# Patient Record
Sex: Female | Born: 1980 | Race: White | Hispanic: No | Marital: Married | Smoking: Never smoker
Health system: Southern US, Community
[De-identification: ages and names within clinical notes are randomized; demographics above are authoritative.]

## PROBLEM LIST (undated history)

## (undated) DIAGNOSIS — A770 Spotted fever due to Rickettsia rickettsii: Secondary | ICD-10-CM

## (undated) DIAGNOSIS — G43909 Migraine, unspecified, not intractable, without status migrainosus: Secondary | ICD-10-CM

## (undated) DIAGNOSIS — K626 Ulcer of anus and rectum: Secondary | ICD-10-CM

## (undated) DIAGNOSIS — R87619 Unspecified abnormal cytological findings in specimens from cervix uteri: Secondary | ICD-10-CM

## (undated) HISTORY — PX: TUBAL LIGATION: SHX77

## (undated) HISTORY — DX: Unspecified abnormal cytological findings in specimens from cervix uteri: R87.619

## (undated) HISTORY — PX: OTHER SURGICAL HISTORY: SHX169

## (undated) HISTORY — DX: Ulcer of anus and rectum: K62.6

## (undated) HISTORY — DX: Migraine, unspecified, not intractable, without status migrainosus: G43.909

## (undated) HISTORY — DX: Spotted fever due to Rickettsia rickettsii: A77.0

---

## 1999-02-14 ENCOUNTER — Other Ambulatory Visit: Admission: RE | Admit: 1999-02-14 | Discharge: 1999-02-14 | Payer: Self-pay | Admitting: Family Medicine

## 1999-08-30 ENCOUNTER — Encounter (INDEPENDENT_AMBULATORY_CARE_PROVIDER_SITE_OTHER): Payer: Self-pay | Admitting: Specialist

## 1999-08-30 ENCOUNTER — Ambulatory Visit (HOSPITAL_COMMUNITY): Admission: RE | Admit: 1999-08-30 | Discharge: 1999-08-30 | Payer: Self-pay | Admitting: Obstetrics and Gynecology

## 2000-06-05 ENCOUNTER — Other Ambulatory Visit: Admission: RE | Admit: 2000-06-05 | Discharge: 2000-06-05 | Payer: Self-pay | Admitting: *Deleted

## 2000-12-29 ENCOUNTER — Inpatient Hospital Stay (HOSPITAL_COMMUNITY): Admission: AD | Admit: 2000-12-29 | Discharge: 2001-01-01 | Payer: Self-pay | Admitting: *Deleted

## 2001-01-29 ENCOUNTER — Other Ambulatory Visit: Admission: RE | Admit: 2001-01-29 | Discharge: 2001-01-29 | Payer: Self-pay | Admitting: Obstetrics and Gynecology

## 2001-05-08 ENCOUNTER — Encounter: Payer: Self-pay | Admitting: Obstetrics and Gynecology

## 2001-05-08 ENCOUNTER — Inpatient Hospital Stay (HOSPITAL_COMMUNITY): Admission: AD | Admit: 2001-05-08 | Discharge: 2001-05-08 | Payer: Self-pay | Admitting: Obstetrics and Gynecology

## 2001-05-12 ENCOUNTER — Encounter: Payer: Self-pay | Admitting: Obstetrics and Gynecology

## 2001-05-12 ENCOUNTER — Encounter: Payer: Self-pay | Admitting: *Deleted

## 2001-05-12 ENCOUNTER — Ambulatory Visit (HOSPITAL_COMMUNITY): Admission: RE | Admit: 2001-05-12 | Discharge: 2001-05-12 | Payer: Self-pay | Admitting: *Deleted

## 2001-12-14 ENCOUNTER — Inpatient Hospital Stay (HOSPITAL_COMMUNITY): Admission: AD | Admit: 2001-12-14 | Discharge: 2001-12-15 | Payer: Self-pay | Admitting: Obstetrics and Gynecology

## 2002-01-26 ENCOUNTER — Other Ambulatory Visit: Admission: RE | Admit: 2002-01-26 | Discharge: 2002-01-26 | Payer: Self-pay | Admitting: *Deleted

## 2007-06-23 ENCOUNTER — Ambulatory Visit (HOSPITAL_COMMUNITY): Admission: RE | Admit: 2007-06-23 | Discharge: 2007-06-23 | Payer: Self-pay | Admitting: Family Medicine

## 2009-12-06 IMAGING — CT CT HEAD W/O CM
1 series · 16 of 30 positions shown, 20 images · non-contrast
Comparison: None

CLINICAL DATA: Severe headache with blurred vision.

CT HEAD WITHOUT CONTRAST
TECHNIQUE: Contiguous axial images were obtained from the base of
the skull through the vertex without contrast.

[Series 2: (id) head 4.8 h37s st · axial · 0.47mm/px · z∈[+75,+206]mm · 16 of 30 slices shown, 20 images]
[im 2/30  brain]
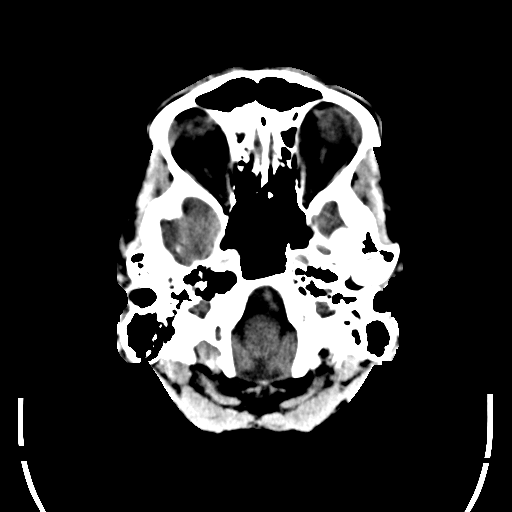
[im 2/30  bone]
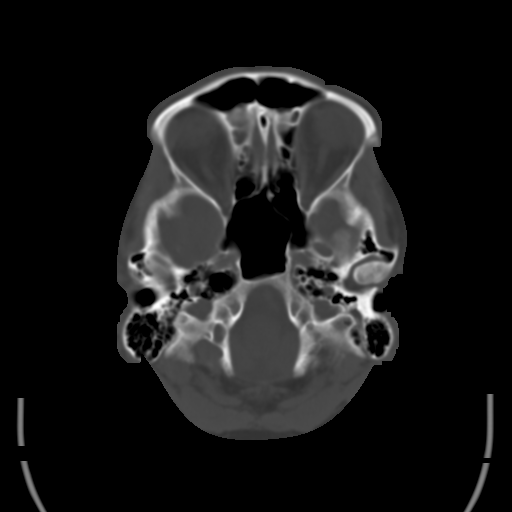
[im 4/30  brain]
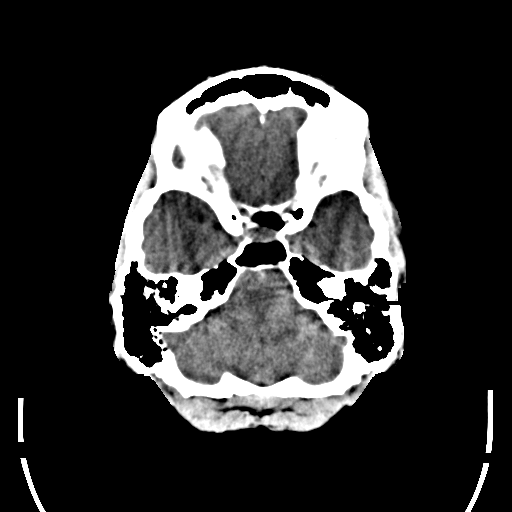
[im 6/30  brain]
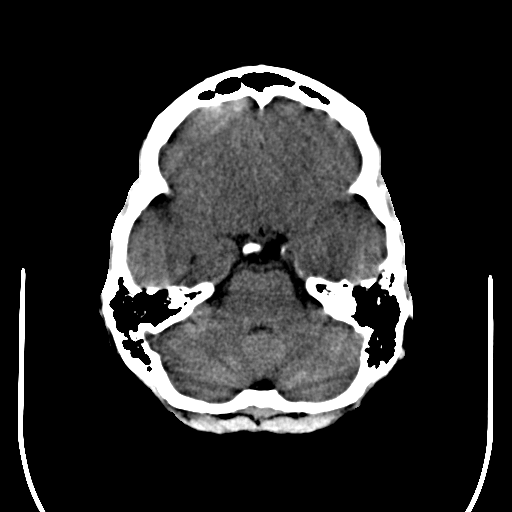
[im 8/30  brain]
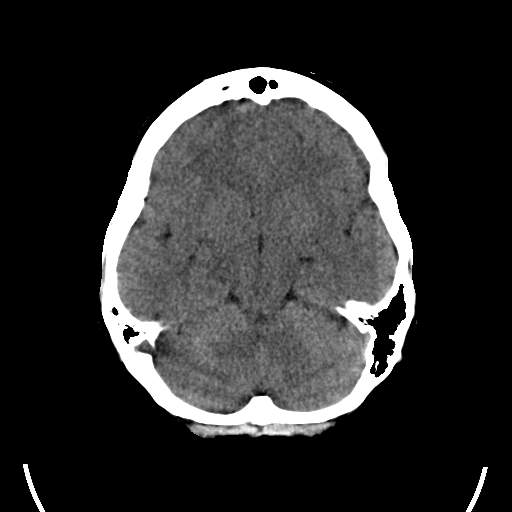
[im 9/30  brain]
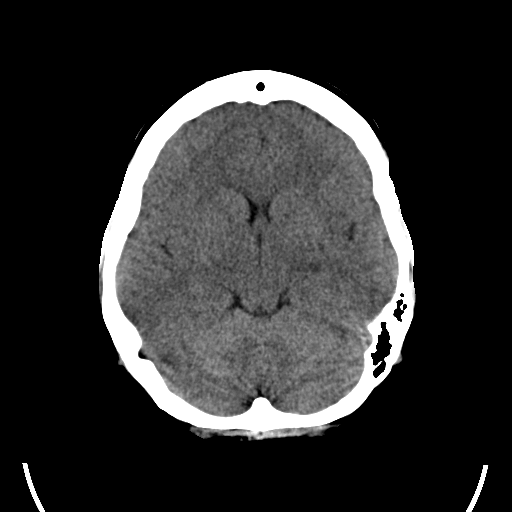
[im 9/30  bone]
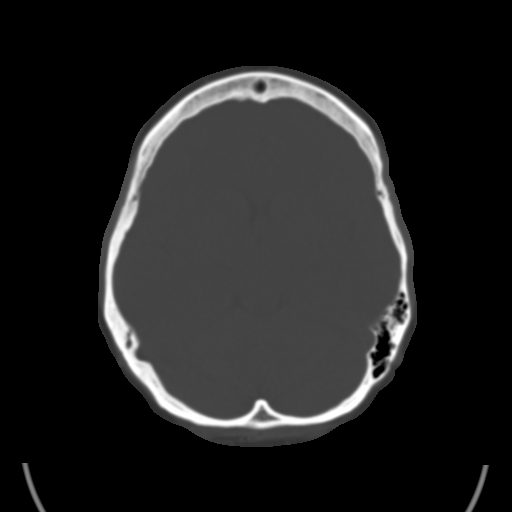
[im 11/30  brain]
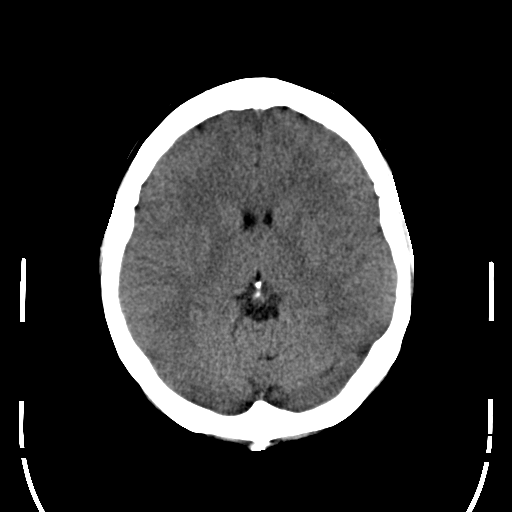
[im 13/30  brain]
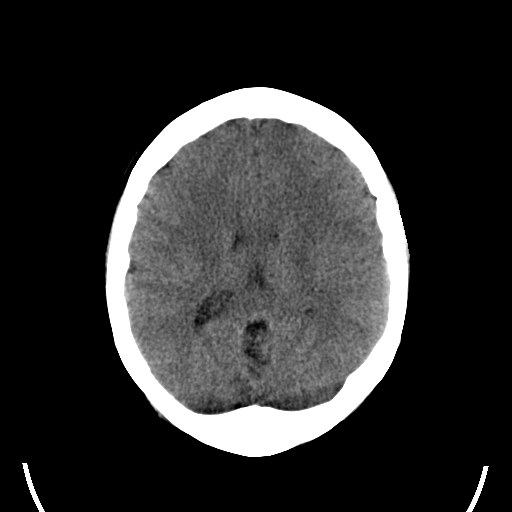
[im 15/30  brain]
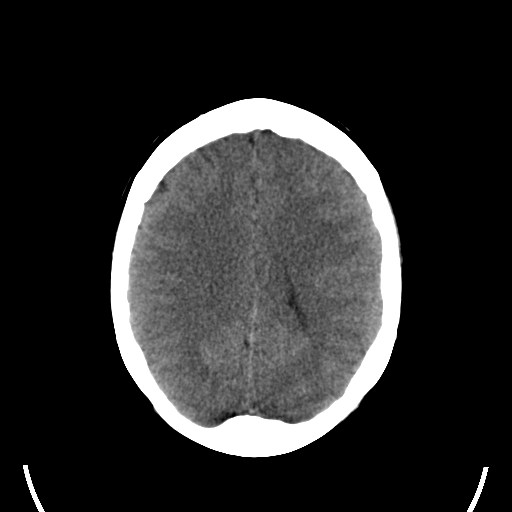
[im 16/30  brain]
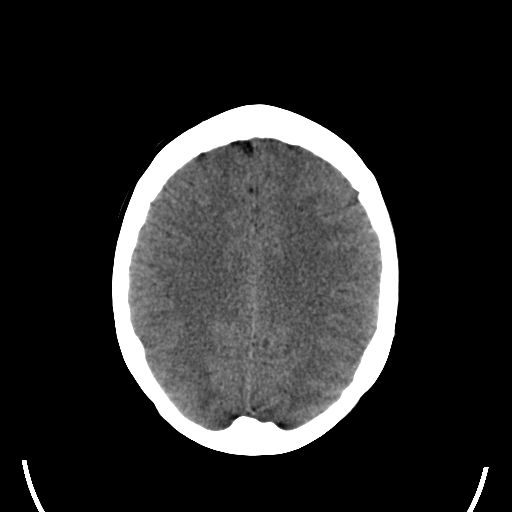
[im 16/30  bone]
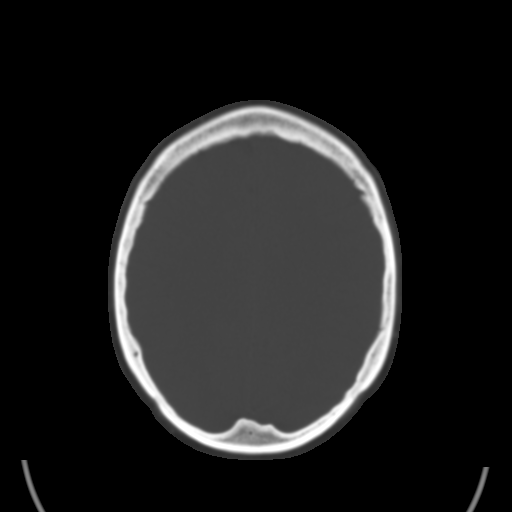
[im 18/30  brain]
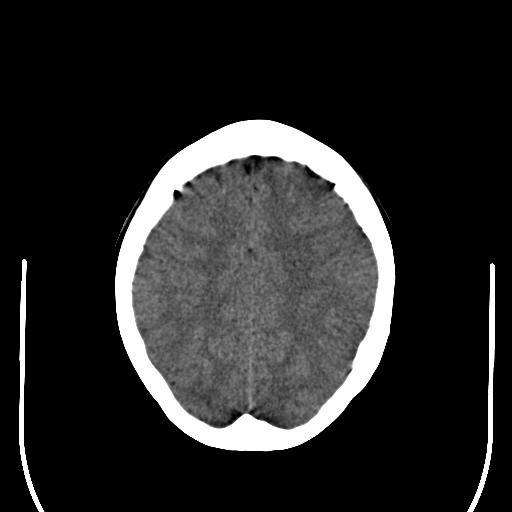
[im 20/30  brain]
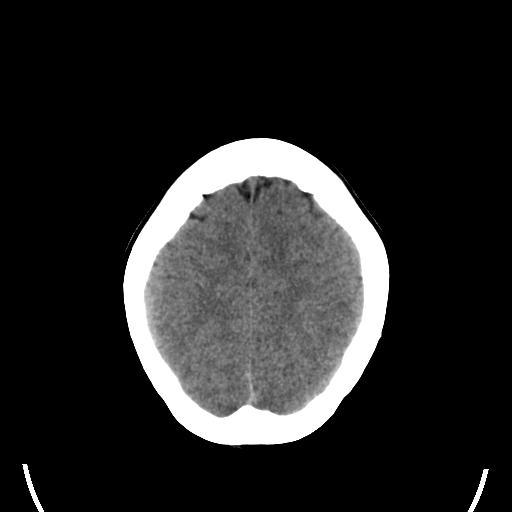
[im 22/30  brain]
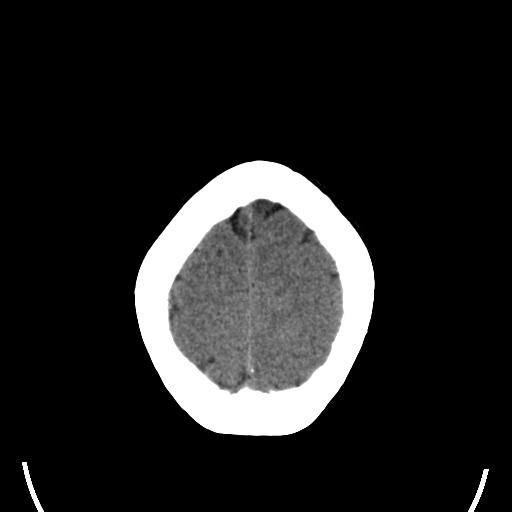
[im 23/30  brain]
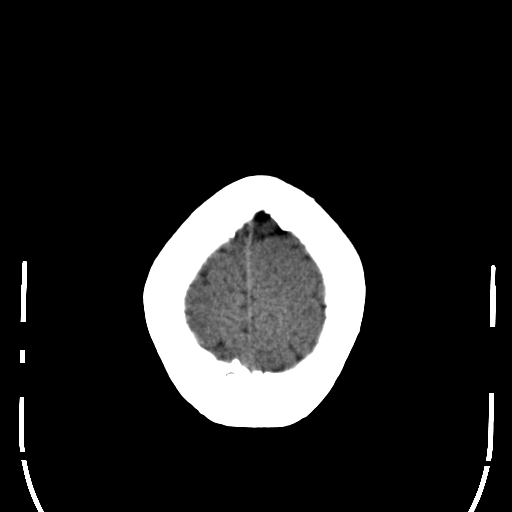
[im 23/30  bone]
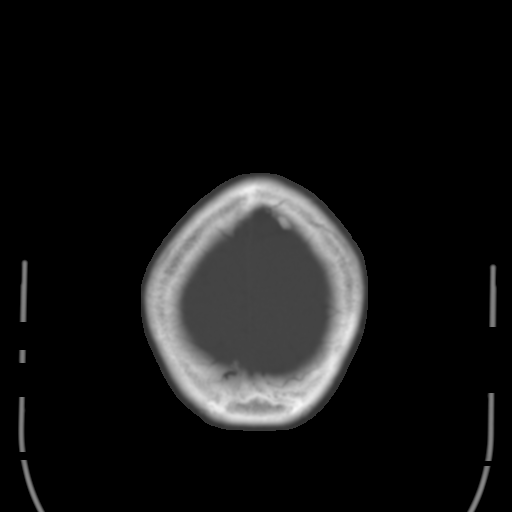
[im 25/30  brain]
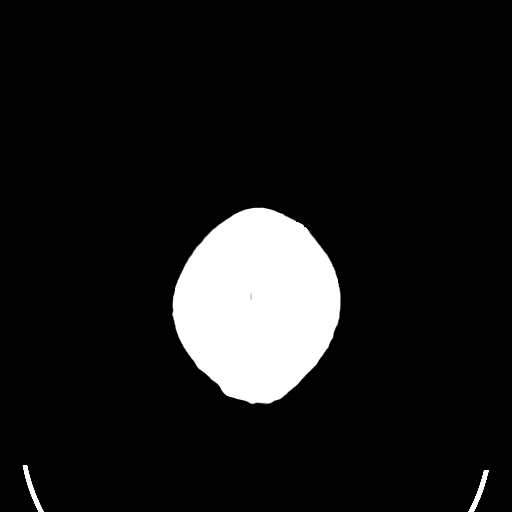
[im 27/30  brain]
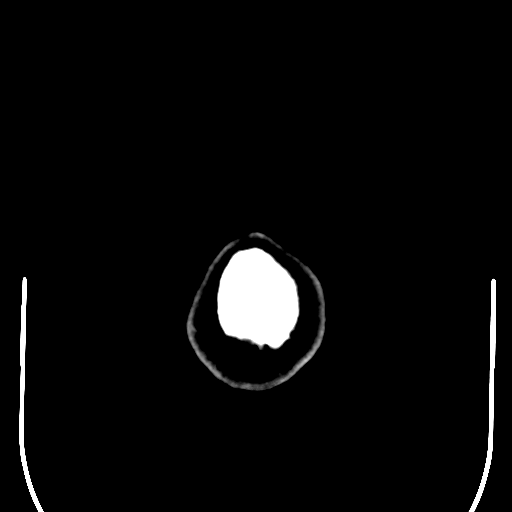
[im 29/30  brain]
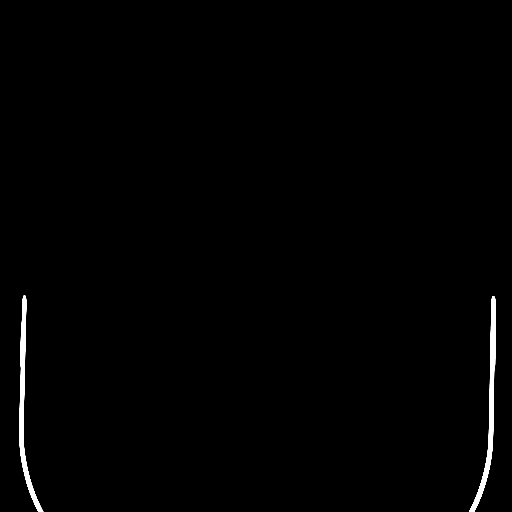

[16 of 30 positions shown; findings below may reference images not displayed]

FINDINGS: No acute intracranial abnormalities are identified,
including mass lesion or mass effect, hydrocephalus, extra-axial
fluid collection, midline shift, hemorrhage, or acute infarction.
Please note that acute infarction may be occult on CT for 24-48
hours.

The visualized bony calvarium is unremarkable.
IMPRESSION: No evidence of acute intracranial abnormality.

## 2010-06-08 NOTE — H&P (Signed)
[facility]  Patient:    Kristine Hahn, Kristine Hahn                       MRN: [id number] Adm. Date:  08/30/99 Attending:  Duke Salvia. Marcelle Overlie, M.D.                         History and Physical  CHIEF COMPLAINT:              Missed abortion.  HISTORY OF PRESENT ILLNESS:   A 30 year old G 1, P 0, new obstetrical patient was seen yesterday with some spotting.  An ultrasound showed an eight-week fetus.  No FHR was noted.  She presents now for a D&E as treated for a missed abortion.  This procedure, including the risks of bleeding, infection, other complications such as perforation may require additional surgery were reviewed with her.  ALLERGIES:                    No known drug allergies.  PAST SURGICAL HISTORY:        None.  REVIEW OF SYSTEMS:            Otherwise negative.  PHYSICAL EXAMINATION:  VITAL SIGNS:                  Temperature 98.2 degrees, blood pressure 120/72.  HEENT:                        Unremarkable.  NECK:                         Supple without mass.  LUNGS:                        Clear.  CARDIOVASCULAR:               A regular rate and rhythm without murmurs, rubs, or gallops.  BREASTS:                      Not examined.  ABDOMEN:                      Soft, flat, nontender.  PELVIC:                       Normal external genitalia.  Vagina and cervix clear.  Minimal spotting was noted.  Cervix was closed.  Uterus eight to nine-week size.  No position.  Adnexa negative.  EXTREMITIES:                  Unremarkable.  NEUROLOGIC:                   Unremarkable.  IMPRESSION:                   Missed abortion.  PLAN:                         Dilatation and evacuation.  The procedure and the risks were discussed as above. DD:  08/30/99 TD:  08/30/99 Job: [id number]

## 2011-01-22 LAB — HM COLONOSCOPY

## 2011-05-07 DIAGNOSIS — R87619 Unspecified abnormal cytological findings in specimens from cervix uteri: Secondary | ICD-10-CM

## 2011-05-07 HISTORY — DX: Unspecified abnormal cytological findings in specimens from cervix uteri: R87.619

## 2011-05-07 LAB — HM PAP SMEAR: HM Pap smear: NEGATIVE

## 2011-07-13 DIAGNOSIS — L280 Lichen simplex chronicus: Secondary | ICD-10-CM | POA: Insufficient documentation

## 2011-07-24 HISTORY — PX: COLPOSCOPY: SHX161

## 2012-05-11 ENCOUNTER — Encounter: Payer: Self-pay | Admitting: Certified Nurse Midwife

## 2012-05-12 ENCOUNTER — Encounter: Payer: Self-pay | Admitting: Certified Nurse Midwife

## 2012-05-12 ENCOUNTER — Ambulatory Visit (INDEPENDENT_AMBULATORY_CARE_PROVIDER_SITE_OTHER): Payer: BC Managed Care – PPO | Admitting: Certified Nurse Midwife

## 2012-05-12 VITALS — BP 104/60 | Ht 66.75 in | Wt 163.0 lb

## 2012-05-12 DIAGNOSIS — L280 Lichen simplex chronicus: Secondary | ICD-10-CM

## 2012-05-12 DIAGNOSIS — Z0000 Encounter for general adult medical examination without abnormal findings: Secondary | ICD-10-CM

## 2012-05-12 DIAGNOSIS — Z01419 Encounter for gynecological examination (general) (routine) without abnormal findings: Secondary | ICD-10-CM

## 2012-05-12 LAB — POCT URINALYSIS DIPSTICK
Bilirubin, UA: NEGATIVE
Blood, UA: NEGATIVE
Ketones, UA: NEGATIVE
pH, UA: 5

## 2012-05-12 MED ORDER — CLOBETASOL PROPIONATE 0.05 % EX OINT: TOPICAL_OINTMENT | Freq: Two times a day (BID) | CUTANEOUS | Status: AC

## 2012-05-12 NOTE — Progress Notes (Signed)
32 y.o. MarriedCaucasian female   [phone] here for annual exam. Periods normal, no problems with in the past.  Patient describes one occurrence of Lichen in the past year.  Patient used Clobetasol with good success.  Needs Rx refill. Patient continues follow up with GI regarding rectal fissure, constipation and hemorrhoids, currently using Linzees with good success.  Sees PCP prn.  No other health issues today.   Patient's last menstrual period was 04/20/2012.          Sexually active: yes  The current method of family planning is tubal ligation.    Exercising: no  exercise Last mammogram: none Last pap: 05-07-11 atypical cells, colpo 07-24-11 ecc neg Last BMD: none Alcohol: none Tobacco: none Colonoscopy/ endoscopy: 1/13 poct urine: neg Hgb: 13.7  Health Maintenance  Topic Date Due  . Pap Smear  05/08/1996  . Tetanus/tdap  05/08/1997  . Influenza Vaccine  09/21/2012    Family History  Problem Relation Age of Onset  . Deep vein thrombosis Mother   . Hypertension Father   . Cancer Maternal Grandmother     stomach    There is no problem list on file for this patient.   Past Medical History  Diagnosis Date  . Migraines     with aura  . [place] spotted fever     Past Surgical History  Procedure Laterality Date  . Colposcopy    . Tubal ligation    . Wisdom teeth      extracted    Allergies: Review of patient's allergies indicates no known allergies.  Current Outpatient Prescriptions  Medication Sig Dispense Refill  . fluticasone (FLONASE) 50 MCG/ACT nasal spray as needed.       Marland Kitchen Linaclotide (LINZESS) 145 MCG CAPS Take by mouth daily.      Marland Kitchen LORATADINE PO Take by mouth daily.       No current facility-administered medications for this visit.    ROS: A comprehensive review of systems was negative.  Exam:    BP 104/60  Ht 5' 6.75" (1.695 m)  Wt 163 lb (73.936 kg)  BMI 25.73 kg/m2  LMP 04/20/2012 Weight change: @WEIGHTCHANGE @ Last 3 height recordings:   Ht Readings from Last 3 Encounters:  05/12/12 5' 6.75" (1.695 m)   General appearance: alert and cooperative Head: Normocephalic, without obvious abnormality, atraumatic Neck: no adenopathy, supple, symmetrical, trachea midline and thyroid not enlarged, symmetric, no tenderness/mass/nodules Lungs: clear to auscultation bilaterally Breasts: normal appearance, no masses or tenderness, No nipple retraction or dimpling, Taught monthly breast self examination Heart: regular rate and rhythm Abdomen: soft, non-tender; bowel sounds normal; no masses,  no organomegaly Extremities: extremities normal, atraumatic, no cyanosis or edema Skin: Skin color, texture, turgor normal. No rashes or lesions Lymph nodes: Cervical, supraclavicular, and axillary nodes normal. no inguinal nodes palpated Neurologic: Grossly normal   Pelvic: External genitalia:  no lesions and small ingrown hair papule noted, no redness or tenderness              Urethra: normal appearing urethra with no masses, tenderness or lesions              Bartholins and Skenes: normal, Bartholin's, Urethra, Skene's normal                 Vagina: normal appearing vagina with normal color and discharge, no lesions              Cervix: normal appearance and thin prep PAP obtained  Pap taken: yes        Bimanual Exam:  Uterus:  uterus is normal size, shape, consistency and nontender                                      Adnexa:    normal adnexa in size, nontender and no masses                                      Rectovaginal: hemmorrhoid tags only                                      Anus:  Small healing fissure       A: Normal well woman exam Contraception; BTL History of Lichen Simplex Rectal fissure under follow up   P: Reviewed health and wellness pertinent to exam  Lab: Pap smear Pap yearly or as indicated Rx Clobetasol see order  Discussed use as soon as flare noted per instructions Continue follow up as  indicated  Rv annually or prn        An After Visit Summary was printed and given to the patient.  Reviewed, TL

## 2012-05-12 NOTE — Patient Instructions (Addendum)

## 2012-05-14 LAB — IPS PAP TEST WITH HPV

## 2013-05-13 ENCOUNTER — Ambulatory Visit: Payer: BC Managed Care – PPO | Admitting: Certified Nurse Midwife

## 2013-05-14 ENCOUNTER — Telehealth: Payer: Self-pay | Admitting: Certified Nurse Midwife

## 2013-05-14 NOTE — Telephone Encounter (Signed)
Left message to patient to call back to reschedule aex

## 2013-05-18 ENCOUNTER — Ambulatory Visit: Payer: BC Managed Care – PPO | Admitting: Certified Nurse Midwife

## 2013-05-18 NOTE — Telephone Encounter (Signed)
Patient called and rescheduled

## 2013-05-19 ENCOUNTER — Encounter: Payer: Self-pay | Admitting: Certified Nurse Midwife

## 2013-05-19 ENCOUNTER — Ambulatory Visit (INDEPENDENT_AMBULATORY_CARE_PROVIDER_SITE_OTHER): Payer: Medicaid Other | Admitting: Certified Nurse Midwife

## 2013-05-19 VITALS — BP 110/70 | HR 70 | Resp 16 | Ht 66.75 in | Wt 168.0 lb

## 2013-05-19 DIAGNOSIS — K649 Unspecified hemorrhoids: Secondary | ICD-10-CM

## 2013-05-19 DIAGNOSIS — Z01419 Encounter for gynecological examination (general) (routine) without abnormal findings: Secondary | ICD-10-CM

## 2013-05-19 DIAGNOSIS — Z0000 Encounter for general adult medical examination without abnormal findings: Secondary | ICD-10-CM

## 2013-05-19 LAB — POCT URINALYSIS DIPSTICK
Bilirubin, UA: NEGATIVE
Glucose, UA: NEGATIVE
Ketones, UA: NEGATIVE
NITRITE UA: NEGATIVE
PROTEIN UA: NEGATIVE
UROBILINOGEN UA: NEGATIVE
pH, UA: 5

## 2013-05-19 NOTE — Progress Notes (Signed)
Reviewed personally.  M. Suzanne Omeka Holben, MD.  

## 2013-05-19 NOTE — Progress Notes (Signed)
33 y.o. [id number] Married Caucasian Fe here for annual exam. Periods normal no issues. No Lichen occurrence in the past year!! Still under follow up with hemorrhoids with GI, recommended surgery. Sees PCP prn. No other health issues today.  Patient's last menstrual period was 05/17/2013.          Sexually active: yes  The current method of family planning is tubal ligation.    Exercising: no  exercise Smoker:  no  Health Maintenance: Pap: 05-12-12 neg HPV HR neg MMG:  none Colonoscopy:  Sig: 1/15 BMD:   none TDaP:  Unsure but within 10 yrs Labs: Poct urine- rbc 2+, wbc-trace, on menstrual cycle Hgb-15.6 Self breast exam: done occ   reports that she has never smoked. She does not have any smokeless tobacco history on file. She reports that she does not drink alcohol or use illicit drugs.  Past Medical History  Diagnosis Date  . Migraines     with aura  [facility]   . Rectal ulcer   . Abnormal Pap smear of cervix 05-07-11    ASCUS HPV not detected    Past Surgical History  Procedure Laterality Date  . Tubal ligation    . Wisdom teeth      extracted  . Colposcopy  07-24-11    ECC negative    Current Outpatient Prescriptions  Medication Sig Dispense Refill  . fexofenadine (ALLEGRA) 180 MG tablet Take 180 mg by mouth daily.      . fluticasone (FLONASE) 50 MCG/ACT nasal spray as needed.       Marland Kitchen. Linaclotide (LINZESS) 145 MCG CAPS Take by mouth daily.       No current facility-administered medications for this visit.    Family History  Problem Relation Age of Onset  . Deep vein thrombosis Mother   . Hypertension Father   . Cancer Maternal Grandmother     stomach    ROS:  Pertinent items are noted in HPI.  Otherwise, a comprehensive ROS was negative.  Exam:   BP 110/70  Pulse 70  Resp 16  Ht 5' 6.75" (1.695 m)  Wt 168 lb (76.204 kg)  BMI 26.52 kg/m2  LMP 05/17/2013 Height: 5' 6.75" (169.5 cm)  Ht Readings from Last 3 Encounters:  05/19/13 5'  6.75" (1.695 m)  05/12/12 5' 6.75" (1.695 m)    General appearance: alert, cooperative and appears stated age Head: Normocephalic, without obvious abnormality, atraumatic Neck: no adenopathy, supple, symmetrical, trachea midline and thyroid normal to inspection and palpation and non-palpable Lungs: clear to auscultation bilaterally Breasts: normal appearance, no masses or tenderness, No nipple retraction or dimpling, No nipple discharge or bleeding, No axillary or supraclavicular adenopathy, large pendulous Heart: regular rate and rhythm Abdomen: soft, non-tender; no masses,  no organomegaly Extremities: extremities normal, atraumatic, no cyanosis or edema Skin: Skin color, texture, turgor normal. No rashes or lesions Lymph nodes: Cervical, supraclavicular, and axillary nodes normal. No abnormal inguinal nodes palpated Neurologic: Grossly normal   Pelvic: External genitalia:  no lesions              Urethra:  normal appearing urethra with no masses, tenderness or lesions              Bartholin's and Skene's: normal                 Vagina: normal appearing vagina with normal color and discharge, no lesions  Cervix: normal, non tender              Pap taken: yes Bimanual Exam:  Uterus:  normal size, contour, position, consistency, mobility, non-tender and anteflexed              Adnexa: normal adnexa and no mass, fullness, tenderness               Rectovaginal: Confirms               Anus: hemorrhoids present not thrombosed, no rectal exam done  A:  Well Woman with normal exam  Contraception BTL  Hemorrhoids under follow up    P:   Reviewed health and wellness pertinent to exam  Pap smear taken today with HPV reflex  Continue follow up as indicated   Counseled on breast self exam, adequate intake of calcium and vitamin D, diet and exercise  return annually or prn  An After Visit Summary was printed and given to the patient.

## 2013-05-19 NOTE — Patient Instructions (Signed)
General topics  Next pap or exam is  due in 1 year Take a Women's multivitamin Take 1200 mg. of calcium daily - prefer dietary If any concerns in interim to call back  Breast Self-Awareness Practicing breast self-awareness may pick up problems early, prevent significant medical complications, and possibly save your life. By practicing breast self-awareness, you can become familiar with how your breasts look and feel and if your breasts are changing. This allows you to notice changes early. It can also offer you some reassurance that your breast health is good. One way to learn what is normal for your breasts and whether your breasts are changing is to do a breast self-exam. If you find a lump or something that was not present in the past, it is best to contact your caregiver right away. Other findings that should be evaluated by your caregiver include nipple discharge, especially if it is bloody; skin changes or reddening; areas where the skin seems to be pulled in (retracted); or new lumps and bumps. Breast pain is seldom associated with cancer (malignancy), but should also be evaluated by a caregiver. BREAST SELF-EXAM The best time to examine your breasts is 5 7 days after your menstrual period is over.  ExitCare Patient Information 2013 [employer].   Exercise to Stay Healthy Exercise helps you become and stay healthy. EXERCISE IDEAS AND TIPS Choose exercises that:  You enjoy.  Fit into your day. You do not need to exercise really hard to be healthy. You can do exercises at a slow or medium level and stay healthy. You can:  Stretch before and after working out.  Try yoga, Pilates, or tai chi.  Lift weights.  Walk fast, swim, jog, run, climb stairs, bicycle, dance, or rollerskate.  Take aerobic classes. Exercises that burn about 150 calories:  Running 1  miles in 15 minutes.  Playing volleyball for 45 to 60 minutes.  Washing and waxing a car for 45 to 60  minutes.  Playing touch football for 45 minutes.  Walking 1  miles in 35 minutes.  Pushing a stroller 1  miles in 30 minutes.  Playing basketball for 30 minutes.  Raking leaves for 30 minutes.  Bicycling 5 miles in 30 minutes.  Walking 2 miles in 30 minutes.  Dancing for 30 minutes.  Shoveling snow for 15 minutes.  Swimming laps for 20 minutes.  Walking up stairs for 15 minutes.  Bicycling 4 miles in 15 minutes.  Gardening for 30 to 45 minutes.  Jumping rope for 15 minutes.  Washing windows or floors for 45 to 60 minutes. Document Released: 02/09/2010 Document Revised: 04/01/2011 Document Reviewed: 02/09/2010 [employer] Patient Information 2013 [employer].   Other topics ( that may be useful information):    Sexually Transmitted Disease Sexually transmitted disease (STD) refers to any infection that is passed from person to person during sexual activity. This may happen by way of saliva, semen, blood, vaginal mucus, or urine. Common STDs include:  Gonorrhea.  Chlamydia.  Syphilis.  HIV/AIDS.  Genital herpes.  Hepatitis B and C.  Trichomonas.  Human papillomavirus (HPV).  Pubic lice. CAUSES  An STD may be spread by bacteria, virus, or parasite. A person can get an STD by:  Sexual intercourse with an infected person.  Sharing sex toys with an infected person.  Sharing needles with an infected person.  Having intimate contact with the genitals, mouth, or rectal areas of an infected person. SYMPTOMS  Some people may not have any symptoms, but   they can still pass the infection to others. Different STDs have different symptoms. Symptoms include:  Painful or bloody urination.  Pain in the pelvis, abdomen, vagina, anus, throat, or eyes.  Skin rash, itching, irritation, growths, or sores (lesions). These usually occur in the genital or anal area.  Abnormal vaginal discharge.  Penile discharge in men.  Soft, flesh-colored skin growths in the  genital or anal area.  Fever.  Pain or bleeding during sexual intercourse.  Swollen glands in the groin area.  Yellow skin and eyes (jaundice). This is seen with hepatitis. DIAGNOSIS  To make a diagnosis, your caregiver may:  Take a medical history.  Perform a physical exam.  Take a specimen (culture) to be examined.  Examine a sample of discharge under a microscope.  Perform blood test TREATMENT   Chlamydia, gonorrhea, trichomonas, and syphilis can be cured with antibiotic medicine.  Genital herpes, hepatitis, and HIV can be treated, but not cured, with prescribed medicines. The medicines will lessen the symptoms.  Genital warts from HPV can be treated with medicine or by freezing, burning (electrocautery), or surgery. Warts may come back.  HPV is a virus and cannot be cured with medicine or surgery.However, abnormal areas may be followed very closely by your caregiver and may be removed from the cervix, vagina, or vulva through office procedures or surgery. If your diagnosis is confirmed, your recent sexual partners need treatment. This is true even if they are symptom-free or have a negative culture or evaluation. They should not have sex until their caregiver says it is okay. HOME CARE INSTRUCTIONS  All sexual partners should be informed, tested, and treated for all STDs.  Take your antibiotics as directed. Finish them even if you start to feel better.  Only take over-the-counter or prescription medicines for pain, discomfort, or fever as directed by your caregiver.  Rest.  Eat a balanced diet and drink enough fluids to keep your urine clear or pale yellow.  Do not have sex until treatment is completed and you have followed up with your caregiver. STDs should be checked after treatment.  Keep all follow-up appointments, Pap tests, and blood tests as directed by your caregiver.  Only use latex condoms and water-soluble lubricants during sexual activity. Do not use  petroleum jelly or oils.  Avoid alcohol and illegal drugs.  Get vaccinated for HPV and hepatitis. If you have not received these vaccines in the past, talk to your caregiver about whether one or both might be right for you.  Avoid risky sex practices that can break the skin. The only way to avoid getting an STD is to avoid all sexual activity.Latex condoms and dental dams (for oral sex) will help lessen the risk of getting an STD, but will not completely eliminate the risk. SEEK MEDICAL CARE IF:   You have a fever.  You have any new or worsening symptoms. Document Released: 03/30/2002 Document Revised: 04/01/2011 Document Reviewed: 04/06/2010 [facility] Patient Information 2013 [employer].    Domestic Abuse You are being battered or abused if someone close to you hits, pushes, or physically hurts you in any way. You also are being abused if you are forced into activities. You are being sexually abused if you are forced to have sexual contact of any kind. You are being emotionally abused if you are made to feel worthless or if you are constantly threatened. It is important to remember that help is available. No one has the right to abuse you. PREVENTION OF FURTHER  ABUSE  Learn the warning signs of danger. This varies with situations but may include: the use of alcohol, threats, isolation from friends and family, or forced sexual contact. Leave if you feel that violence is going to occur.  If you are attacked or beaten, report it to the police so the abuse is documented. You do not have to press charges. The police can protect you while you or the attackers are leaving. Get the officer's name and badge number and a copy of the report.  Find someone you can trust and tell them what is happening to you: your caregiver, a nurse, clergy member, close friend or family member. Feeling ashamed is natural, but remember that you have done nothing wrong. No one deserves abuse. Document Released:  01/05/2000 Document Revised: 04/01/2011 Document Reviewed: 03/15/2010 [employer] Patient Information 2013 [employer].    How Much is Too Much Alcohol? Drinking too much alcohol can cause injury, accidents, and health problems. These types of problems can include:   Car crashes.  Falls.  Family fighting (domestic violence).  Drowning.  Fights.  Injuries.  Burns.  Damage to certain organs.  Having a baby with birth defects. ONE DRINK CAN BE TOO MUCH WHEN YOU ARE:  Working.  Pregnant or breastfeeding.  Taking medicines. Ask your doctor.  Driving or planning to drive. If you or someone you know has a drinking problem, get help from a doctor.  Document Released: 11/03/2008 Document Revised: 04/01/2011 Document Reviewed: 11/03/2008 Exit[employer] Patient Information 2013 [employer].   Smoking Hazards Smoking cigarettes is extremely bad for your health. Tobacco smoke has over 200 known poisons in it. There are over 60 chemicals in tobacco smoke that cause cancer. Some of the chemicals found in cigarette smoke include:   Cyanide.  Benzene.  Formaldehyde.  Methanol (wood alcohol).  Acetylene (fuel used in welding torches).  Ammonia. Cigarette smoke also contains the poisonous gases nitrogen oxide and carbon monoxide.  Cigarette smokers have an increased risk of many serious medical problems and Smoking causes approximately:  90% of all lung cancer deaths in men.  80% of all lung cancer deaths in women.  90% of deaths from chronic obstructive lung disease. Compared with nonsmokers, smoking increases the risk of:  Coronary heart disease by 2 to 4 times.  Stroke by 2 to 4 times.  Men developing lung cancer by 23 times.  Women developing lung cancer by 13 times.  Dying from chronic obstructive lung diseases by 12 times.  . Smoking is the most preventable cause of death and disease in our society.  WHY IS SMOKING ADDICTIVE?  Nicotine is the chemical  agent in tobacco that is capable of causing addiction or dependence.  When you smoke and inhale, nicotine is absorbed rapidly into the bloodstream through your lungs. Nicotine absorbed through the lungs is capable of creating a powerful addiction. Both inhaled and non-inhaled nicotine may be addictive.  Addiction studies of cigarettes and spit tobacco show that addiction to nicotine occurs mainly during the teen years, when young people begin using tobacco products. WHAT ARE THE BENEFITS OF QUITTING?  There are many health benefits to quitting smoking.   Likelihood of developing cancer and heart disease decreases. Health improvements are seen almost immediately.  Blood pressure, pulse rate, and breathing patterns start returning to normal soon after quitting. QUITTING SMOKING   American Lung Association - 1-800-LUNGUSA  American Cancer Society - 1-800-ACS-2345 Document Released: 02/15/2004 Document Revised: 04/01/2011 Document Reviewed: 10/19/2008 Exit[employer] Patient Information 2013 [employer].   Stress Management Stress is a state of physical or mental tension that often results from changes in your life or normal routine. Some common causes of stress are:  Death of a loved one.  Injuries or severe illnesses.  Getting fired or changing jobs.  Moving into a new home. Other causes may be:  Sexual problems.  Business or financial losses.  Taking on a large debt.  Regular conflict with someone at home or at work.  Constant tiredness from lack of sleep. It is not just bad things that are stressful. It may be stressful to:  Win the lottery.  Get married.  Buy a new car. The amount of stress that can be easily tolerated varies from person to person. Changes generally cause stress, regardless of the types of change. Too much stress can affect your health. It may lead to physical or emotional problems. Too little stress (boredom) may also become stressful. SUGGESTIONS TO  REDUCE STRESS:  Talk things over with your family and friends. It often is helpful to share your concerns and worries. If you feel your problem is serious, you may want to get help from a professional counselor.  Consider your problems one at a time instead of lumping them all together. Trying to take care of everything at once may seem impossible. List all the things you need to do and then start with the most important one. Set a goal to accomplish 2 or 3 things each day. If you expect to do too many in a single day you will naturally fail, causing you to feel even more stressed.  Do not use alcohol or drugs to relieve stress. Although you may feel better for a short time, they do not remove the problems that caused the stress. They can also be habit forming.  Exercise regularly - at least 3 times per week. Physical exercise can help to relieve that "uptight" feeling and will relax you.  The shortest distance between despair and hope is often a good night's sleep.  Go to bed and get up on time allowing yourself time for appointments without being rushed.  Take a short "time-out" period from any stressful situation that occurs during the day. Close your eyes and take some deep breaths. Starting with the muscles in your face, tense them, hold it for a few seconds, then relax. Repeat this with the muscles in your neck, shoulders, hand, stomach, back and legs.  Take good care of yourself. Eat a balanced diet and get plenty of rest.  Schedule time for having fun. Take a break from your daily routine to relax. HOME CARE INSTRUCTIONS   Call if you feel overwhelmed by your problems and feel you can no longer manage them on your own.  Return immediately if you feel like hurting yourself or someone else. Document Released: 07/03/2000 Document Revised: 04/01/2011 Document Reviewed: 02/23/2007 ExitCare Patient Information 2013 [employer].   

## 2013-05-20 LAB — HEMOGLOBIN, FINGERSTICK: Hemoglobin, fingerstick: 15.6 g/dL (ref 12.0–16.0)

## 2013-05-21 LAB — IPS PAP TEST WITH REFLEX TO HPV

## 2013-11-22 ENCOUNTER — Encounter: Payer: Self-pay | Admitting: Certified Nurse Midwife

## 2014-03-15 ENCOUNTER — Telehealth: Payer: Self-pay | Admitting: Certified Nurse Midwife

## 2014-03-15 NOTE — Telephone Encounter (Addendum)
Left patient messages to call back to reschedule her AEX from 05/24/14.

## 2014-05-24 ENCOUNTER — Ambulatory Visit: Payer: Self-pay | Admitting: Certified Nurse Midwife

## 2022-12-31 ENCOUNTER — Other Ambulatory Visit: Payer: Self-pay | Admitting: Family Medicine

## 2022-12-31 DIAGNOSIS — Z1231 Encounter for screening mammogram for malignant neoplasm of breast: Secondary | ICD-10-CM

## 2023-01-29 ENCOUNTER — Ambulatory Visit
Admission: RE | Admit: 2023-01-29 | Discharge: 2023-01-29 | Disposition: A | Discharge status: Home / Self Care | Payer: 59 | Source: Ambulatory Visit | Attending: Family Medicine | Admitting: Family Medicine

## 2023-01-29 DIAGNOSIS — Z1231 Encounter for screening mammogram for malignant neoplasm of breast: Secondary | ICD-10-CM

## 2023-01-30 ENCOUNTER — Telehealth: Payer: Self-pay | Admitting: Family Medicine

## 2023-01-30 ENCOUNTER — Other Ambulatory Visit: Payer: Self-pay | Admitting: Family Medicine

## 2023-01-30 DIAGNOSIS — R928 Other abnormal and inconclusive findings on diagnostic imaging of breast: Secondary | ICD-10-CM

## 2023-01-30 NOTE — Telephone Encounter (Signed)
 The patient was notified of results. Diagnostic mammogram +/- ultrasound of the right breast to be ordered.   Lavonda Jumbo, DO [facility] 01/30/2023, 8:45 AM

## 2023-02-03 ENCOUNTER — Encounter: Payer: Self-pay | Admitting: Family Medicine

## 2023-02-18 ENCOUNTER — Ambulatory Visit
Admission: RE | Admit: 2023-02-18 | Discharge: 2023-02-18 | Disposition: A | Discharge status: Home / Self Care | Payer: 59 | Source: Ambulatory Visit | Attending: Family Medicine

## 2023-02-18 ENCOUNTER — Ambulatory Visit
Admission: RE | Admit: 2023-02-18 | Discharge: 2023-02-18 | Disposition: A | Discharge status: Home / Self Care | Payer: 59 | Source: Ambulatory Visit | Attending: Family Medicine | Admitting: Family Medicine

## 2023-02-18 DIAGNOSIS — R928 Other abnormal and inconclusive findings on diagnostic imaging of breast: Secondary | ICD-10-CM

## 2023-02-19 ENCOUNTER — Other Ambulatory Visit: Payer: Self-pay | Admitting: Family Medicine

## 2023-02-19 DIAGNOSIS — N6489 Other specified disorders of breast: Secondary | ICD-10-CM

## 2023-08-19 ENCOUNTER — Ambulatory Visit: Payer: 59

## 2023-08-19 ENCOUNTER — Ambulatory Visit
Admission: RE | Admit: 2023-08-19 | Discharge: 2023-08-19 | Disposition: A | Discharge status: Home / Self Care | Payer: 59 | Source: Ambulatory Visit | Attending: Family Medicine | Admitting: Family Medicine

## 2023-08-19 DIAGNOSIS — N6489 Other specified disorders of breast: Secondary | ICD-10-CM
